# Patient Record
Sex: Female | Born: 1990 | Race: Black or African American | Hispanic: Yes | Smoking: Former smoker
Health system: Southern US, Community
[De-identification: ages and names within clinical notes are randomized; demographics above are authoritative.]

## PROBLEM LIST (undated history)

## (undated) DIAGNOSIS — R519 Headache, unspecified: Secondary | ICD-10-CM

## (undated) HISTORY — PX: NO PAST SURGERIES: SHX2092

---
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
## 2009-04-22 ENCOUNTER — Emergency Department (HOSPITAL_COMMUNITY): Admission: EM | Admit: 2009-04-22 | Discharge: 2009-04-22 | Payer: Self-pay | Admitting: Emergency Medicine

## 2009-06-06 ENCOUNTER — Emergency Department (HOSPITAL_COMMUNITY): Admission: EM | Admit: 2009-06-06 | Discharge: 2009-06-06 | Payer: Self-pay | Admitting: Family Medicine

## 2009-12-13 ENCOUNTER — Emergency Department (HOSPITAL_COMMUNITY): Admission: EM | Admit: 2009-12-13 | Discharge: 2009-12-13 | Payer: Self-pay | Admitting: Family Medicine

## 2012-01-05 ENCOUNTER — Emergency Department (HOSPITAL_COMMUNITY)
Admission: EM | Admit: 2012-01-05 | Discharge: 2012-01-05 | Disposition: A | Discharge status: Home / Self Care | Payer: Self-pay | Attending: Emergency Medicine | Admitting: Emergency Medicine

## 2012-01-05 ENCOUNTER — Encounter (HOSPITAL_COMMUNITY): Payer: Self-pay

## 2012-01-05 DIAGNOSIS — B86 Scabies: Secondary | ICD-10-CM | POA: Insufficient documentation

## 2012-01-05 DIAGNOSIS — F17200 Nicotine dependence, unspecified, uncomplicated: Secondary | ICD-10-CM | POA: Insufficient documentation

## 2012-01-05 MED ORDER — PERMETHRIN 5 % EX CREA: TOPICAL_CREAM | CUTANEOUS | Status: DC

## 2012-01-05 NOTE — ED Notes (Signed)
Generalized rash x 1 month

## 2012-01-05 NOTE — ED Provider Notes (Signed)
Medical screening examination/treatment/procedure(s) were performed by non-physician practitioner and as supervising physician I was immediately available for consultation/collaboration.  Tyianna Menefee, MD 01/05/12 1632 

## 2012-01-05 NOTE — ED Provider Notes (Signed)
History     CSN: [id number]  Arrival date & time 01/05/12  1323   First MD Initiated Contact with Patient 01/05/12 1326      Chief Complaint  Patient presents with  . Rash    (Consider location/radiation/quality/duration/timing/severity/associated sxs/prior treatment) HPI Comments: Patient came to ED with significant other who was seen by me and diagnosed with scabies.  States that she also has pruritic bumps on her bilateral hands and wrists, as well as ankles.  States her thighs itch but she hasn't seen any lesions.  Denies any fevers, discharge from the rash.  Pt denies any known exposures to insects, denies change in personal care products, exposure to any environmental exposures.  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    Patient is a 21 y.o. female presenting with rash. The history is provided by the patient and a significant other.  Rash     History reviewed. No pertinent past medical history.  History reviewed. No pertinent past surgical history.  No family history on file.  History  Substance Use Topics  . Smoking status: Current Every Day Smoker  . Smokeless tobacco: Not on file  . Alcohol Use: Yes    OB History    Grav Para Term Preterm Abortions TAB SAB Ect Mult Living                  Review of Systems  Constitutional: Negative for fever and chills.  HENT: Negative for sore throat and trouble swallowing.   Respiratory: Negative for shortness of breath.   Skin: Positive for rash.    Allergies  Review of patient's allergies indicates no known allergies.  Home Medications   Current Outpatient Rx  Name Route Sig Dispense Refill  . PERMETHRIN 5 % EX CREA  Apply to affected area once.  Leave on for 8-12 hours then wash off.  Repeat in 7 days if needed. 60 g 1    BP 118/75  Pulse 61  Temp 98.5 F (36.9 C) (Oral)  Resp 18  Ht 5\' 7"  (1.702 m)  Wt 220 lb (99.791 kg)  BMI 34.46 kg/m2  SpO2  100%  LMP 12/06/2011  Physical Exam  Nursing note and vitals reviewed. Constitutional: She appears well-developed and well-nourished. No distress.  HENT:  Head: Normocephalic and atraumatic.  Neck: Neck supple.  Pulmonary/Chest: Effort normal.  Neurological: She is alert.  Skin: She is not diaphoretic.       Pt with few papules over both hands and onto ventral wrist.  Few papules over medial ankles as well.  No erythema, edema, warmth, discharge.      ED Course  Procedures (including critical care time)  Labs Reviewed - No data to display No results found.   1. Scabies     MDM  Pt came in to ED with significant other who was diagnosed with scabies.  Pt also with rash consistent with scabies, though her rash is less extensive.  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  No e/o superinfection.  Discussed diagnosis and treatment plan with patient.  Pt given return precautions.  Pt verbalizes understanding and agrees with plan.           [place], [place] 01/05/12 [phone]

## 2013-01-17 ENCOUNTER — Emergency Department (HOSPITAL_COMMUNITY): Payer: 59

## 2013-01-17 ENCOUNTER — Encounter (HOSPITAL_COMMUNITY): Payer: Self-pay | Admitting: Emergency Medicine

## 2013-01-17 ENCOUNTER — Emergency Department (HOSPITAL_COMMUNITY)
Admission: EM | Admit: 2013-01-17 | Discharge: 2013-01-17 | Disposition: A | Discharge status: Home / Self Care | Payer: 59 | Attending: Emergency Medicine | Admitting: Emergency Medicine

## 2013-01-17 DIAGNOSIS — F17200 Nicotine dependence, unspecified, uncomplicated: Secondary | ICD-10-CM | POA: Insufficient documentation

## 2013-01-17 DIAGNOSIS — S4980XA Other specified injuries of shoulder and upper arm, unspecified arm, initial encounter: Secondary | ICD-10-CM | POA: Insufficient documentation

## 2013-01-17 DIAGNOSIS — S46909A Unspecified injury of unspecified muscle, fascia and tendon at shoulder and upper arm level, unspecified arm, initial encounter: Secondary | ICD-10-CM | POA: Insufficient documentation

## 2013-01-17 DIAGNOSIS — S4992XA Unspecified injury of left shoulder and upper arm, initial encounter: Secondary | ICD-10-CM

## 2013-01-17 MED ORDER — HYDROCODONE-ACETAMINOPHEN 5-325 MG PO TABS: 1.0000 | ORAL_TABLET | ORAL | Status: DC | PRN
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
## 2013-01-17 NOTE — ED Notes (Signed)
Pt states she has had vaginal bleeding/spotting for over a month.  Does not want evaluated for that at this time.

## 2013-01-24 NOTE — ED Provider Notes (Signed)
Medical screening examination/treatment/procedure(s) were performed by non-physician practitioner and as supervising physician I was immediately available for consultation/collaboration.  Carlton Sweaney, MD 01/24/13 1439 

## 2014-08-19 IMAGING — CR DG SHOULDER 2+V*L*
3 series · 3 of 3 positions shown · non-contrast
Comparison: None.

CLINICAL DATA: Left shoulder pain. No injury.

EXAM:
LEFT SHOULDER - 2+ VIEW

[w shoulder internal left]
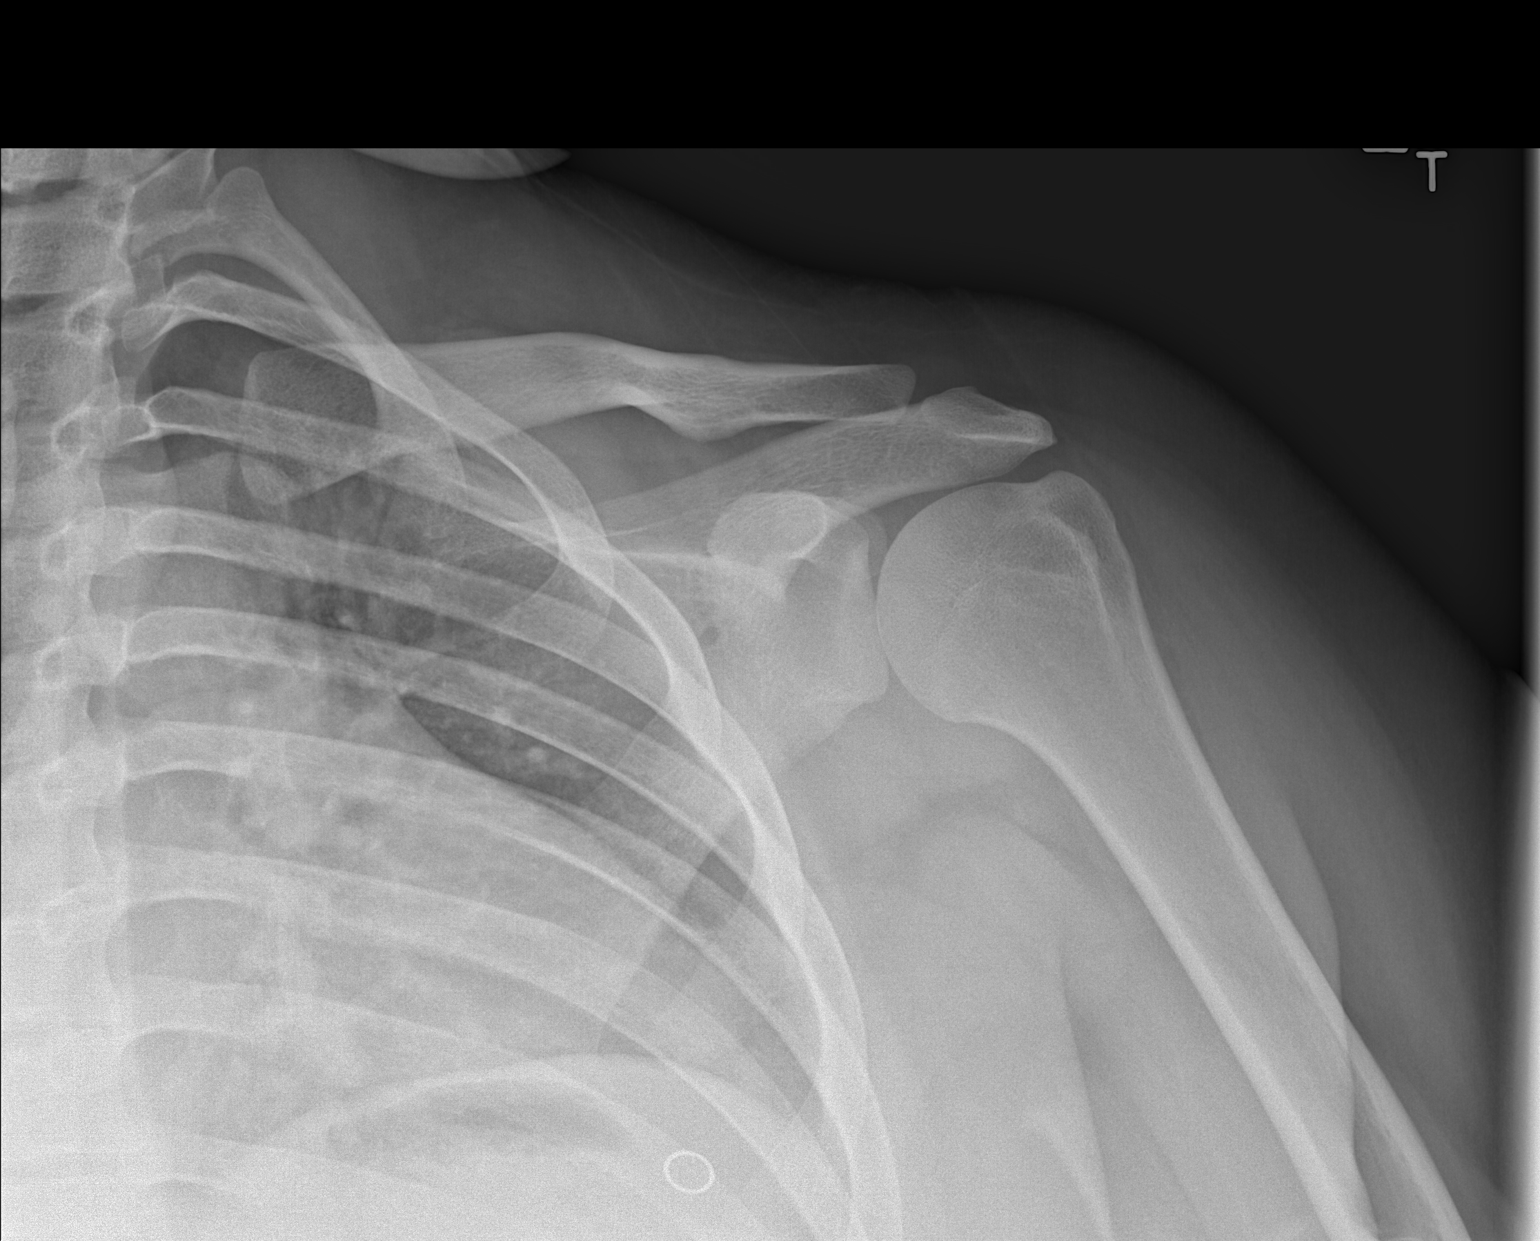

[w shoulder y-view left]
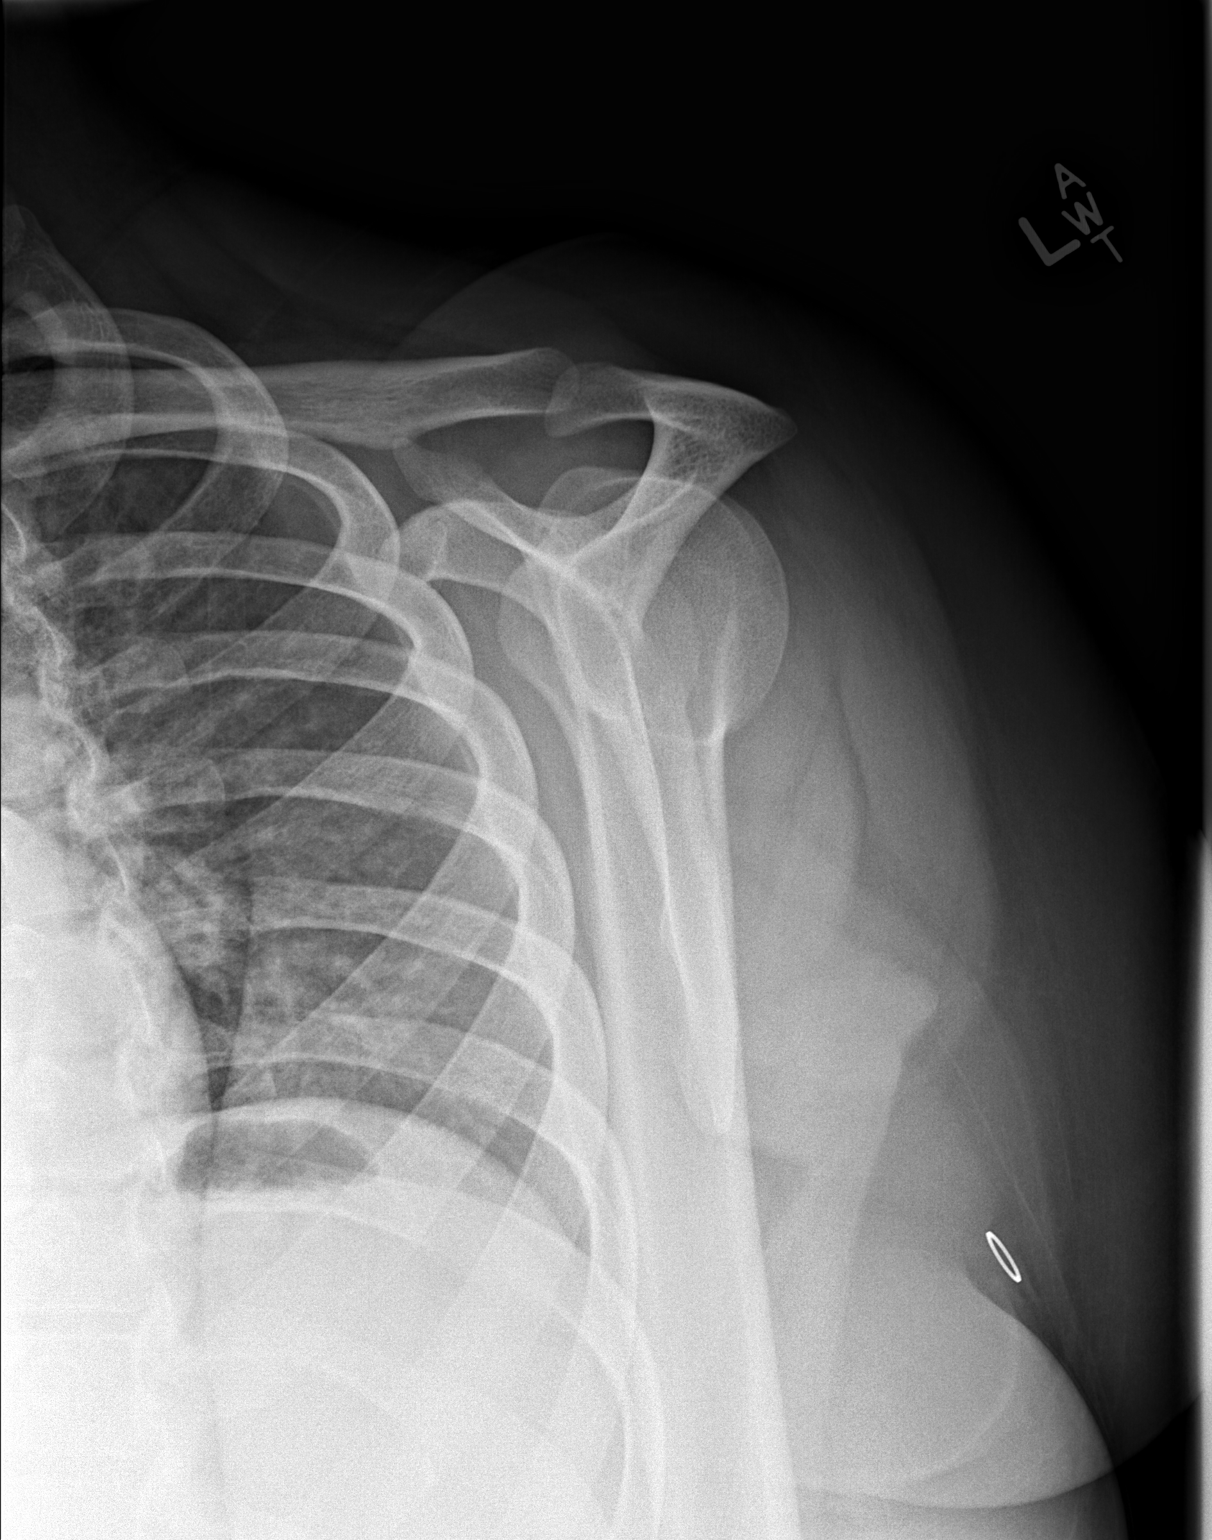

[x shoulder axillary left]
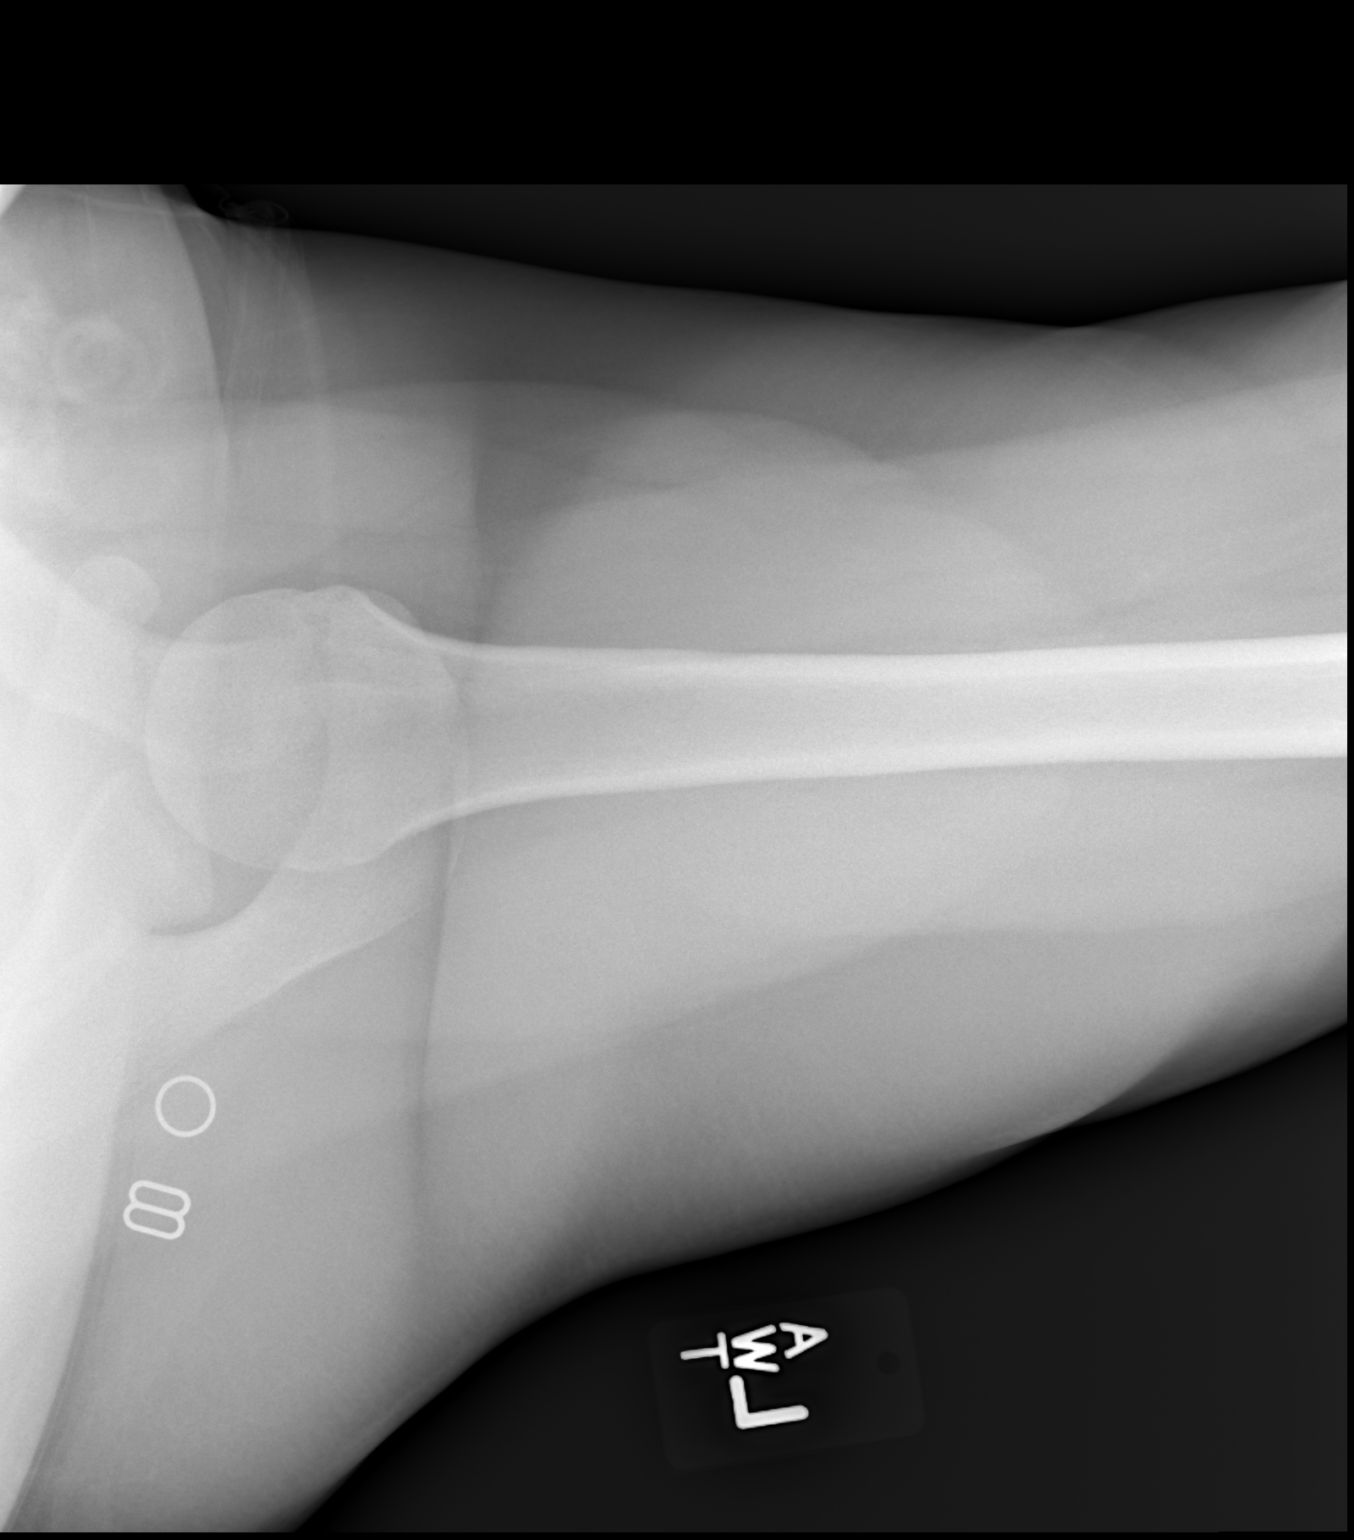

[3 of 3 positions shown; findings below may reference images not displayed]

FINDINGS: There is no evidence of fracture or dislocation. There is no
evidence of arthropathy or other focal bone abnormality. Soft
tissues are unremarkable.
IMPRESSION: Negative.

## 2020-06-13 ENCOUNTER — Other Ambulatory Visit: Payer: Self-pay

## 2020-06-13 ENCOUNTER — Encounter (HOSPITAL_COMMUNITY): Payer: Self-pay

## 2020-06-13 ENCOUNTER — Ambulatory Visit (HOSPITAL_COMMUNITY)
Admission: EM | Admit: 2020-06-13 | Discharge: 2020-06-13 | Disposition: A | Discharge status: Home / Self Care | Payer: Self-pay | Attending: Emergency Medicine | Admitting: Emergency Medicine

## 2020-06-13 DIAGNOSIS — Z202 Contact with and (suspected) exposure to infections with a predominantly sexual mode of transmission: Secondary | ICD-10-CM | POA: Insufficient documentation

## 2020-06-13 LAB — POCT URINALYSIS DIPSTICK, ED / UC
Bilirubin Urine: NEGATIVE
Glucose, UA: NEGATIVE mg/dL
Ketones, ur: NEGATIVE mg/dL
Leukocytes,Ua: NEGATIVE
Nitrite: NEGATIVE
Protein, ur: NEGATIVE mg/dL
Specific Gravity, Urine: 1.01 (ref 1.005–1.030)
Urobilinogen, UA: 0.2 mg/dL (ref 0.0–1.0)
pH: 5.5 (ref 5.0–8.0)

## 2020-06-13 LAB — RAPID HIV SCREEN (HIV 1/2 AB+AG)
HIV 1/2 Antibodies: NONREACTIVE
HIV-1 P24 Antigen - HIV24: NONREACTIVE

## 2020-06-13 LAB — POC URINE PREG, ED: Preg Test, Ur: NEGATIVE

## 2020-06-13 NOTE — ED Triage Notes (Signed)
Pt is here to be tested for STD. Pt denies exposure and sxs.
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
## 2020-06-14 ENCOUNTER — Telehealth (HOSPITAL_COMMUNITY): Payer: Self-pay | Admitting: Emergency Medicine

## 2020-06-14 LAB — CERVICOVAGINAL ANCILLARY ONLY
Bacterial Vaginitis (gardnerella): POSITIVE — AB
Candida Glabrata: NEGATIVE
Candida Vaginitis: NEGATIVE
Chlamydia: NEGATIVE
Comment: NEGATIVE
Comment: NEGATIVE
Comment: NEGATIVE
Comment: NEGATIVE
Comment: NEGATIVE
Comment: NORMAL
Neisseria Gonorrhea: NEGATIVE
Trichomonas: NEGATIVE

## 2020-06-14 LAB — RPR: RPR Ser Ql: NONREACTIVE

## 2020-06-14 MED ORDER — METRONIDAZOLE 500 MG PO TABS: 500.0000 mg | ORAL_TABLET | Freq: Two times a day (BID) | ORAL | 0 refills | Status: DC

## 2020-06-14 NOTE — Telephone Encounter (Signed)
Patient was called and identified. Patient was notified on test results and appropriate treatment guideline. Prescription was sent to pharmacy per treatment guidelines.

## 2021-12-11 ENCOUNTER — Other Ambulatory Visit: Payer: Self-pay

## 2021-12-11 ENCOUNTER — Encounter (HOSPITAL_COMMUNITY): Payer: Self-pay | Admitting: Obstetrics and Gynecology

## 2021-12-11 ENCOUNTER — Inpatient Hospital Stay (HOSPITAL_COMMUNITY): Payer: Self-pay

## 2021-12-11 ENCOUNTER — Inpatient Hospital Stay (HOSPITAL_COMMUNITY)
Admission: AD | Admit: 2021-12-11 | Discharge: 2021-12-12 | Disposition: A | Discharge status: Home / Self Care | Payer: Self-pay | Attending: Obstetrics & Gynecology | Admitting: Obstetrics & Gynecology

## 2021-12-11 ENCOUNTER — Inpatient Hospital Stay (HOSPITAL_COMMUNITY)
Admission: AD | Admit: 2021-12-11 | Discharge: 2021-12-11 | Disposition: A | Discharge status: Home / Self Care | Payer: Self-pay | Attending: Obstetrics and Gynecology | Admitting: Obstetrics and Gynecology

## 2021-12-11 ENCOUNTER — Ambulatory Visit (INDEPENDENT_AMBULATORY_CARE_PROVIDER_SITE_OTHER): Payer: Self-pay

## 2021-12-11 VITALS — BP 129/78 | HR 78 | Wt 248.5 lb

## 2021-12-11 DIAGNOSIS — Z3A01 Less than 8 weeks gestation of pregnancy: Secondary | ICD-10-CM | POA: Insufficient documentation

## 2021-12-11 DIAGNOSIS — Z3201 Encounter for pregnancy test, result positive: Secondary | ICD-10-CM

## 2021-12-11 DIAGNOSIS — R109 Unspecified abdominal pain: Secondary | ICD-10-CM | POA: Insufficient documentation

## 2021-12-11 DIAGNOSIS — O3680X0 Pregnancy with inconclusive fetal viability, not applicable or unspecified: Secondary | ICD-10-CM | POA: Insufficient documentation

## 2021-12-11 DIAGNOSIS — Z87891 Personal history of nicotine dependence: Secondary | ICD-10-CM | POA: Insufficient documentation

## 2021-12-11 DIAGNOSIS — O209 Hemorrhage in early pregnancy, unspecified: Secondary | ICD-10-CM | POA: Insufficient documentation

## 2021-12-11 DIAGNOSIS — Z6790 Unspecified blood type, Rh positive: Secondary | ICD-10-CM

## 2021-12-11 DIAGNOSIS — O26891 Other specified pregnancy related conditions, first trimester: Secondary | ICD-10-CM | POA: Insufficient documentation

## 2021-12-11 DIAGNOSIS — O3680X1 Pregnancy with inconclusive fetal viability, fetus 1: Secondary | ICD-10-CM

## 2021-12-11 DIAGNOSIS — O039 Complete or unspecified spontaneous abortion without complication: Secondary | ICD-10-CM | POA: Insufficient documentation

## 2021-12-11 HISTORY — DX: Headache, unspecified: R51.9

## 2021-12-11 LAB — WET PREP, GENITAL
Clue Cells Wet Prep HPF POC: NONE SEEN
Sperm: NONE SEEN
Trich, Wet Prep: NONE SEEN
WBC, Wet Prep HPF POC: <10 (ref <10)
Yeast Wet Prep HPF POC: NONE SEEN

## 2021-12-11 LAB — CBC
HCT: 38.2 % (ref 36.0–46.0)
Hemoglobin: 12.7 g/dL (ref 12.0–15.0)
MCH: 27.1 pg (ref 26.0–34.0)
MCHC: 33.2 g/dL (ref 30.0–36.0)
MCV: 81.4 fL (ref 80.0–100.0)
Platelets: 340 10*3/uL (ref 150–400)
RBC: 4.69 MIL/uL (ref 3.87–5.11)
RDW: 15.9 % — ABNORMAL HIGH (ref 11.5–15.5)
WBC: 4.4 10*3/uL (ref 4.0–10.5)
nRBC: 0 % (ref 0.0–0.2)

## 2021-12-11 LAB — ABO/RH: ABO/RH(D): A POS

## 2021-12-11 LAB — HCG, QUANTITATIVE, PREGNANCY: hCG, Beta Chain, Quant, S: 12069 m[IU]/mL — ABNORMAL HIGH (ref <5)

## 2021-12-11 LAB — POCT PREGNANCY, URINE: Preg Test, Ur: POSITIVE — AB

## 2021-12-11 NOTE — Progress Notes (Signed)
Pt here today for UPT. Pt states LMP was 10/02/2021. Started spotting this Tuesday and then Wednesday had bleeding like a period that has continued today.  Pt states having cramps and clots. Urine given today for UPT was bloody in color. Pt had positive UPT today in office.  G1P0.   Reviewed with Dr Shawnie Pons. Advised pt to have STAT US for pregnancy of unknown location. Called MFM, no spots available. Will send pt to MAU for evaluation. Dr Crissie Reese made aware at MAU. Pt given results and recommendations. Pt sent to MAU from office. Pt agreeable to plan of care and verbalized understanding.   Resean Brander,RNC

## 2021-12-11 NOTE — MAU Note (Signed)
Andrea Potter is a 31 y.o. at [redacted]w[redacted]d here in MAU reporting: +HPT 8/25. Confirmed at clinic today. spotting started on Tues, now full on bleeding.  Last night started having bad cramps.  Feeling like was going to start cycle. LMP: 7/29 Onset of complaint: Tues Pain score: moderate Vitals:   12/11/21 1007  BP: 127/78  Pulse: 64  Resp: 18  Temp: 98.4 F (36.9 C)  SpO2: 99%      Lab orders placed from triage:

## 2021-12-11 NOTE — MAU Provider Note (Signed)
History     CSN: [id number]  Arrival date and time: 12/11/21 6333   Event Date/Time   First Provider Initiated Contact with Patient 12/11/21 1029      Chief Complaint  Patient presents with   Abdominal Pain   Vaginal Bleeding   31 y.o. G1 @[redacted]w[redacted]d  by sure LMP presenting with cramping and VB. Reports onset of lower abdominal cramping a few days ago then started bleeding yesterday. She soaked through 2 pads yesterday and passed nickel and quarter sized clots. Rates cramping pain 5/10. Has not taken anything for it.    OB History     Gravida  1   Para      Term      Preterm      AB      Living         SAB      IAB      Ectopic      Multiple      Live Births              Past Medical History:  Diagnosis Date   Headache     Past Surgical History:  Procedure Laterality Date   NO PAST SURGERIES      Family History  Problem Relation Age of Onset   Healthy Mother    Diabetes Father     Social History   Tobacco Use   Smoking status: Former    Types: Cigarettes   Smokeless tobacco: Never   Tobacco comments:    Quit 2018  Vaping Use   Vaping Use: Never used  Substance Use Topics   Alcohol use: Not Currently    Comment: social   Drug use: No    Allergies: No Known Allergies  No medications prior to admission.    Review of Systems  Constitutional:  Negative for fatigue.  Gastrointestinal:  Positive for abdominal pain.  Genitourinary:  Positive for vaginal bleeding.   Physical Exam   Blood pressure 119/67, pulse 64, temperature 98.4 F (36.9 C), temperature source Oral, resp. rate 18, height 5' 7.5" (1.715 m), weight 113.4 kg, last menstrual period 11/01/2021, SpO2 99 %.  Physical Exam Vitals and nursing note reviewed. Exam conducted with a chaperone present.  Constitutional:      General: She is not in acute distress.    Appearance: Normal appearance.  HENT:     Head: Normocephalic and atraumatic.  Cardiovascular:     Rate and  Rhythm: Normal rate.  Pulmonary:     Effort: Pulmonary effort is normal. No respiratory distress.  Abdominal:     General: There is no distension.     Palpations: Abdomen is soft. There is no mass.     Tenderness: There is no abdominal tenderness. There is no guarding or rebound.     Hernia: No hernia is present.  Genitourinary:    Comments: External: no lesions or erythema Vagina: rugated, pink, moist, small amt bloody discharge, cleared with 1 fox swab Uterus/Adnexae: indeterminate d/t habitus, no CMT Cervix FT   Musculoskeletal:        General: Normal range of motion.     Cervical back: Normal range of motion.  Skin:    General: Skin is warm and dry.  Neurological:     General: No focal deficit present.     Mental Status: She is alert and oriented to person, place, and time.  Psychiatric:        Mood and Affect: Mood normal.  Behavior: Behavior normal.    Results for orders placed or performed during the hospital encounter of 12/11/21 (from the past 24 hour(s))  ABO/Rh     Status: None   Collection Time: 12/11/21 10:11 AM  Result Value Ref Range   ABO/RH(D) A POS    No rh immune globuloin      NOT A RH IMMUNE GLOBULIN CANDIDATE, PT RH POSITIVE Performed at [facility], [address] [place], [place] [address]   CBC     Status: Abnormal   Collection Time: 12/11/21 10:11 AM  Result Value Ref Range   WBC 4.4 4.0 - 10.5 K/uL   RBC 4.69 3.87 - 5.11 MIL/uL   Hemoglobin 12.7 12.0 - 15.0 g/dL   HCT 88.4 16.6 - 06.3 %   MCV 81.4 80.0 - 100.0 fL   MCH 27.1 26.0 - 34.0 pg   MCHC 33.2 30.0 - 36.0 g/dL   RDW 01.6 (H) 01.0 - 93.2 %   Platelets 340 150 - 400 K/uL   nRBC 0.0 0.0 - 0.2 %  hCG, quantitative, pregnancy     Status: Abnormal   Collection Time: 12/11/21 10:11 AM  Result Value Ref Range   hCG, Beta Chain, Quant, S 12,069 (H) <5 mIU/mL  Wet prep, genital     Status: None   Collection Time: 12/11/21 10:40 AM   Specimen: Vaginal  Result Value Ref  Range   Yeast Wet Prep HPF POC NONE SEEN NONE SEEN   Trich, Wet Prep NONE SEEN NONE SEEN   Clue Cells Wet Prep HPF POC NONE SEEN NONE SEEN   WBC, Wet Prep HPF POC <10 <10   Sperm NONE SEEN    US OB LESS THAN 14 WEEKS WITH OB TRANSVAGINAL  Result Date: 12/11/2021 CLINICAL DATA:  Vaginal bleeding, first trimester pregnancy EXAM: OBSTETRIC <14 WK [place] AND TRANSVAGINAL OB US TECHNIQUE: Both transabdominal and transvaginal ultrasound examinations were performed for complete evaluation of the gestation as well as the maternal uterus, adnexal regions, and pelvic cul-de-sac. Transvaginal technique was performed to assess early pregnancy. COMPARISON:  None Available. FINDINGS: Intrauterine gestational sac: Single Yolk sac:  Not seen Embryo:  Seen Cardiac Activity: Not seen Heart Rate:   bpm CRL:  4.45 mm   6 w   1 d                  [place] EDC: 08/02/2022 Subchorionic hemorrhage:  None visualized. Maternal uterus/adnexae: Unremarkable. IMPRESSION: There is a gestational sac containing fetal pole within the fundus of the uterus. There is no demonstrable fetal cardiac activity. Findings suggest possible failed gestation with incomplete abortion. Another diagnostic possibility would be very early IUP. Please correlate with serial hCG estimations and consider short-term follow-up sonogram in 1-2 weeks. There are no adnexal masses.  There is no free fluid in pelvis. Electronically Signed   By: Ernie Avena M.D.   On: 12/11/2021 11:44    MAU Course  Procedures  MDM Declines analgesic. Labs and [place] ordered and reviewed. IUP seen on [place] although no cardiac activity, doesn't meet criteria but suspicious for nonviable pregnancy. Discussed findings with pt and partner. Plan for f/u US in 1 week for viability. Stable for discharge home.  Assessment and Plan   1. [redacted] weeks gestation of pregnancy   2. Pregnancy with inconclusive fetal viability, single or unspecified fetus    Discharge home Follow up at [facility] in 1 week  for US-ordered SAB/bleeding precautions Pelvic rest   Allergies  as of 12/11/2021   No Known Allergies      Medication List    You have not been prescribed any medications.     Donette Larry, CNM 12/11/2021, 12:58 PM

## 2021-12-12 ENCOUNTER — Encounter (HOSPITAL_COMMUNITY): Payer: Self-pay | Admitting: Obstetrics & Gynecology

## 2021-12-12 DIAGNOSIS — O039 Complete or unspecified spontaneous abortion without complication: Secondary | ICD-10-CM

## 2021-12-12 MED ORDER — ONDANSETRON 4 MG PO TBDP
8.0000 mg | ORAL_TABLET | Freq: Once | ORAL | Status: AC
  Administered 2021-12-12: 8 mg via ORAL
  Filled 2021-12-12 (×2): qty 2

## 2021-12-12 NOTE — MAU Note (Signed)
.  Andrea Potter is a 31 y.o. at [redacted]w[redacted]d here in MAU reporting increased vag bleeding and abdominal cramping. Was seen in MAU earlier today for the same. States went home and ate and took a nap. Awoke at 1900 with bleeding and cramping. Had BM x 2 and with second BM passed a clot which she took a picture of it but did not save it. Came in by EMS and registered in lobby  Onset of complaint: 1900 Pain score: 6 Vitals:   12/12/21 0033  BP: 139/66  Pulse: (!) 56  Resp: 17  Temp: 97.8 F (36.6 C)  SpO2: 97%     FHT:n/a Lab orders placed from triage:  none

## 2021-12-12 NOTE — MAU Provider Note (Signed)
History     CSN: [id number]  Arrival date and time: 12/11/21 2303   Event Date/Time   First Provider Initiated Contact with Patient 12/12/21 0045      Chief Complaint  Patient presents with   Abdominal Pain   Vaginal Bleeding   Andrea Potter is a 31 y.o. G1P0 at 5.6 weeks by LMP.  She presents today for Abdominal Pain and Vaginal Bleeding.  She states she had passed a clot about the size of a half dollar when using the restroom.  She reports shortly after she started to experience pain in her lower abdomen that she describes as sharp, but intermittent.  She states the pain was a 8/10 at that time.  She reports the pain worsened and she took some Aleve and called EMS.  She states upon arrival her pain was the same and occurring every 3-5 minutes. She states she has not passed any other clots and her bleeding has "slowed down."      OB History     Gravida  1   Para      Term      Preterm      AB      Living         SAB      IAB      Ectopic      Multiple      Live Births              Past Medical History:  Diagnosis Date   Headache     Past Surgical History:  Procedure Laterality Date   NO PAST SURGERIES      Family History  Problem Relation Age of Onset   Healthy Mother    Diabetes Father     Social History   Tobacco Use   Smoking status: Former    Types: Cigarettes   Smokeless tobacco: Never   Tobacco comments:    Quit 2018  Vaping Use   Vaping Use: Never used  Substance Use Topics   Alcohol use: Not Currently    Comment: social   Drug use: Yes    Comment: regular use    Allergies: No Known Allergies  Medications Prior to Admission  Medication Sig Dispense Refill Last Dose   naproxen sodium (ALEVE) 220 MG tablet Take 220 mg by mouth.   12/11/2021    Review of Systems  Gastrointestinal:  Positive for abdominal pain.  Genitourinary:  Positive for vaginal bleeding. Negative for difficulty urinating and dysuria.   Physical Exam    Blood pressure 139/66, pulse (!) 56, temperature 97.8 F (36.6 C), resp. rate 17, height 5' 7.5" (1.715 m), weight 113.3 kg, last menstrual period 11/01/2021, SpO2 97 %.  Physical Exam Vitals reviewed.  Constitutional:      Appearance: Normal appearance. She is well-developed.  HENT:     Head: Normocephalic and atraumatic.  Eyes:     Conjunctiva/sclera: Conjunctivae normal.  Cardiovascular:     Rate and Rhythm: Normal rate.  Pulmonary:     Effort: Pulmonary effort is normal. No respiratory distress.  Musculoskeletal:        General: Normal range of motion.     Cervical back: Normal range of motion.  Skin:    General: Skin is warm.  Neurological:     Mental Status: She is alert and oriented to person, place, and time.  Psychiatric:        Mood and Affect: Mood normal.  Behavior: Behavior normal.     MAU Course  Procedures Results for orders placed or performed during the hospital encounter of 12/11/21 (from the past 24 hour(s))  ABO/Rh     Status: None   Collection Time: 12/11/21 10:11 AM  Result Value Ref Range   ABO/RH(D) A POS    No rh immune globuloin      NOT A RH IMMUNE GLOBULIN CANDIDATE, PT RH POSITIVE Performed at [facility], [address] [place], [place] [address]   CBC     Status: Abnormal   Collection Time: 12/11/21 10:11 AM  Result Value Ref Range   WBC 4.4 4.0 - 10.5 K/uL   RBC 4.69 3.87 - 5.11 MIL/uL   Hemoglobin 12.7 12.0 - 15.0 g/dL   HCT 83.3 82.5 - 05.3 %   MCV 81.4 80.0 - 100.0 fL   MCH 27.1 26.0 - 34.0 pg   MCHC 33.2 30.0 - 36.0 g/dL   RDW 97.6 (H) 73.4 - 19.3 %   Platelets 340 150 - 400 K/uL   nRBC 0.0 0.0 - 0.2 %  hCG, quantitative, pregnancy     Status: Abnormal   Collection Time: 12/11/21 10:11 AM  Result Value Ref Range   hCG, Beta Chain, Quant, S 12,069 (H) <5 mIU/mL  Wet prep, genital     Status: None   Collection Time: 12/11/21 10:40 AM   Specimen: Vaginal  Result Value Ref Range   Yeast Wet Prep HPF POC NONE  SEEN NONE SEEN   Trich, Wet Prep NONE SEEN NONE SEEN   Clue Cells Wet Prep HPF POC NONE SEEN NONE SEEN   WBC, Wet Prep HPF POC <10 <10   Sperm NONE SEEN    US OB Transvaginal  Result Date: 12/12/2021 CLINICAL DATA:  Vaginal bleeding and passing clots in the first trimester. EXAM: TRANSVAGINAL OB ULTRASOUND TECHNIQUE: Transvaginal ultrasound was performed for complete evaluation of the gestation as well as the maternal uterus, adnexal regions, and pelvic cul-de-sac. COMPARISON:  Ob ultrasound yesterday. FINDINGS: Intrauterine gestational sac: No longer visible. Yolk sac:  Not Visualized. Embryo:  No longer visible. Cardiac Activity: N/a Subchorionic hemorrhage:  None visualized. Maternal uterus/adnexae: The uterus not optimally visualized due to its position. There is heterogeneity and thickening of the endometrial echo complex without significant vascularity. There is extension of hypoechoic avascular material into the partially open cervix with the cervical channel measuring up to 7.4 mm in diameter. No uterine wall mass is seen. No free fluid is evident.  Both ovaries are obscured by bowel gas. IMPRESSION: 1. Findings likely indicate an abortion in progress or missed abortion. The uterine contents are not optimally visualized but no vascular flow is convincingly seen within the uterus and there is no longer visualization of a fetal pole and yolk sac. There is heterogeneous thickening of the endometrial echo complex most likely due to blood clots. 2. Extension of material into the partially open cervix. 3. Nonvisualization of both ovaries. 4. If there are continued symptoms, short interval follow-up study would be helpful to exclude retained products of conception. Electronically Signed   By: Almira Bar M.D.   On: 12/12/2021 00:43   US OB LESS THAN 14 WEEKS WITH OB TRANSVAGINAL  Result Date: 12/11/2021 CLINICAL DATA:  Vaginal bleeding, first trimester pregnancy EXAM: OBSTETRIC <14 WK [place] AND  TRANSVAGINAL OB US TECHNIQUE: Both transabdominal and transvaginal ultrasound examinations were performed for complete evaluation of the gestation as well as the maternal uterus, adnexal regions, and  pelvic cul-de-sac. Transvaginal technique was performed to assess early pregnancy. COMPARISON:  None Available. FINDINGS: Intrauterine gestational sac: Single Yolk sac:  Not seen Embryo:  Seen Cardiac Activity: Not seen Heart Rate:   bpm CRL:  4.45 mm   6 w   1 d                  [place] EDC: 08/02/2022 Subchorionic hemorrhage:  None visualized. Maternal uterus/adnexae: Unremarkable. IMPRESSION: There is a gestational sac containing fetal pole within the fundus of the uterus. There is no demonstrable fetal cardiac activity. Findings suggest possible failed gestation with incomplete abortion. Another diagnostic possibility would be very early IUP. Please correlate with serial hCG estimations and consider short-term follow-up sonogram in 1-2 weeks. There are no adnexal masses.  There is no free fluid in pelvis. Electronically Signed   By: Ernie Avena M.D.   On: 12/11/2021 11:44    MDM Ultrasound Antiemetic Assessment and Plan  31 year old Spontaneous Abortion  -Patient sent to [place] upon arrival. -Results as above. -Provider to bedside to discuss. -Condolences given.  -Informed of follow up in 2 weeks with repeat labs. -Precautions reviewed. -Patient requests medication for nausea. Zofran ordered. -Informed okay to take ibuprofen at home for pain. -Message sent to [facility] for Follow Up appt. -Discharged to home in stable condition.  Cherre Robins 12/12/2021, 12:45 AM

## 2021-12-12 NOTE — MAU Note (Signed)
Pt was having to wait in lobby for Triage due to MAU being busy. Pt taken for u/s by w/c. Pt evaluated earlier today in MAU and returned by EMS due to increased abdominal pain and bleeding

## 2021-12-12 NOTE — Progress Notes (Signed)
Gerrit Heck CNM in earlier to discuss u/s report and d/c plan. Written and verbal d/c instructions given and understanding voiced.

## 2021-12-15 LAB — GC/CHLAMYDIA PROBE AMP (~~LOC~~) NOT AT ARMC
Chlamydia: NEGATIVE
Comment: NEGATIVE
Comment: NORMAL
Neisseria Gonorrhea: NEGATIVE

## 2021-12-16 ENCOUNTER — Inpatient Hospital Stay (HOSPITAL_COMMUNITY)
Admission: AD | Admit: 2021-12-16 | Discharge: 2021-12-16 | Disposition: A | Discharge status: Home / Self Care | Payer: Self-pay | Attending: Obstetrics & Gynecology | Admitting: Obstetrics & Gynecology

## 2021-12-16 ENCOUNTER — Other Ambulatory Visit: Payer: Self-pay

## 2021-12-16 ENCOUNTER — Inpatient Hospital Stay (HOSPITAL_COMMUNITY): Payer: Self-pay

## 2021-12-16 DIAGNOSIS — O039 Complete or unspecified spontaneous abortion without complication: Secondary | ICD-10-CM

## 2021-12-16 DIAGNOSIS — O034 Incomplete spontaneous abortion without complication: Secondary | ICD-10-CM | POA: Insufficient documentation

## 2021-12-16 DIAGNOSIS — Z3A01 Less than 8 weeks gestation of pregnancy: Secondary | ICD-10-CM | POA: Insufficient documentation

## 2021-12-16 DIAGNOSIS — O26891 Other specified pregnancy related conditions, first trimester: Secondary | ICD-10-CM | POA: Insufficient documentation

## 2021-12-16 LAB — CBC
HCT: 36 % (ref 36.0–46.0)
Hemoglobin: 11.9 g/dL — ABNORMAL LOW (ref 12.0–15.0)
MCH: 27.5 pg (ref 26.0–34.0)
MCHC: 33.1 g/dL (ref 30.0–36.0)
MCV: 83.3 fL (ref 80.0–100.0)
Platelets: 348 10*3/uL (ref 150–400)
RBC: 4.32 MIL/uL (ref 3.87–5.11)
RDW: 15.8 % — ABNORMAL HIGH (ref 11.5–15.5)
WBC: 5 10*3/uL (ref 4.0–10.5)
nRBC: 0 % (ref 0.0–0.2)

## 2021-12-16 LAB — HCG, QUANTITATIVE, PREGNANCY: hCG, Beta Chain, Quant, S: 178 m[IU]/mL — ABNORMAL HIGH (ref <5)

## 2021-12-16 NOTE — MAU Note (Signed)
Andrea Potter is a 31 y.o. at [redacted]w[redacted]d here in MAU reporting: ongoing pain and bleeding since previous MAU visit. States she is still seeing some clots. Changing about 4-5 pads per day but not saturated.   Onset of complaint: ongoing  Pain score: 6/10  Vitals:   12/16/21 1726  BP: 120/77  Pulse: 67  Resp: 16  Temp: 98 F (36.7 C)  SpO2: 100%     Lab orders placed from triage: none

## 2021-12-16 NOTE — MAU Provider Note (Signed)
History     CSN: [id number]  Arrival date and time: 12/16/21 1712   None     Chief Complaint  Patient presents with   Abdominal Pain   Vaginal Bleeding   Andrea Potter, a  31 y.o. G1P0 at [redacted]w[redacted]d presents to MAU with complaints of intermittent abdominal pain and vaginal bleeding. Patient states she was seen in MAU on 12/12/21 for a SAB in process. She states she bled for "2 days afterwards and passed something large." She stopped bleeding and cramping for 1 day then today started intermittently bleeding and cramping again. States changing pad multiple times throughout the day, but not saturating any pads. She describes bleeding and red and brown and has small clots throughout the day. Currently rating pain a 7/10. Denies urinary symptoms.          Abdominal Pain The primary symptoms of the illness include abdominal pain and vaginal bleeding. The primary symptoms of the illness do not include fever, fatigue, shortness of breath, nausea, vomiting, diarrhea, dysuria or vaginal discharge.  Symptoms associated with the illness do not include chills, constipation or back pain.  Vaginal Bleeding Associated symptoms include abdominal pain. Pertinent negatives include no chest pain, chills, fatigue, fever, headaches, nausea, vomiting or weakness.    OB History     Gravida  1   Para      Term      Preterm      AB      Living         SAB      IAB      Ectopic      Multiple      Live Births              Past Medical History:  Diagnosis Date   Headache     Past Surgical History:  Procedure Laterality Date   NO PAST SURGERIES      Family History  Problem Relation Age of Onset   Healthy Mother    Diabetes Father     Social History   Tobacco Use   Smoking status: Former    Types: Cigarettes   Smokeless tobacco: Never   Tobacco comments:    Quit 2018  Vaping Use   Vaping Use: Never used  Substance Use Topics   Alcohol use: Not Currently    Comment:  social   Drug use: Yes    Comment: regular use    Allergies: No Known Allergies  Medications Prior to Admission  Medication Sig Dispense Refill Last Dose   naproxen sodium (ALEVE) 220 MG tablet Take 220 mg by mouth.       Review of Systems  Constitutional:  Negative for chills, fatigue and fever.  Eyes:  Negative for pain and visual disturbance.  Respiratory:  Negative for apnea, shortness of breath and wheezing.   Cardiovascular:  Negative for chest pain and palpitations.  Gastrointestinal:  Positive for abdominal pain. Negative for constipation, diarrhea, nausea and vomiting.  Genitourinary:  Positive for vaginal bleeding. Negative for difficulty urinating, dysuria, pelvic pain, vaginal discharge and vaginal pain.  Musculoskeletal:  Negative for back pain.  Neurological:  Negative for seizures, weakness and headaches.  Psychiatric/Behavioral:  Negative for suicidal ideas.    Physical Exam   Blood pressure 120/77, pulse 67, temperature 98 F (36.7 C), temperature source Oral, resp. rate 16, height 5' 7.5" (1.715 m), weight 114.5 kg, last menstrual period 11/01/2021, SpO2 100 %.  Physical Exam Vitals and nursing note reviewed.  Constitutional:      General: She is not in acute distress.    Appearance: Normal appearance.  HENT:     Head: Normocephalic.  Pulmonary:     Effort: Pulmonary effort is normal.  Musculoskeletal:     Cervical back: Normal range of motion.  Skin:    General: Skin is warm and dry.  Neurological:     Mental Status: She is alert and oriented to person, place, and time.  Psychiatric:        Mood and Affect: Mood normal.     MAU Course  Procedures Lab and ultrasound orders placed from the lobby.   Orders Placed This Encounter  Procedures   US OB Transvaginal   CBC   hCG, quantitative, pregnancy   Discharge patient   Results for orders placed or performed during the hospital encounter of 12/16/21 (from the past 24 hour(s))  CBC     Status:  Abnormal   Collection Time: 12/16/21  6:18 PM  Result Value Ref Range   WBC 5.0 4.0 - 10.5 K/uL   RBC 4.32 3.87 - 5.11 MIL/uL   Hemoglobin 11.9 (L) 12.0 - 15.0 g/dL   HCT 78.2 42.3 - 53.6 %   MCV 83.3 80.0 - 100.0 fL   MCH 27.5 26.0 - 34.0 pg   MCHC 33.1 30.0 - 36.0 g/dL   RDW 14.4 (H) 31.5 - 40.0 %   Platelets 348 150 - 400 K/uL   nRBC 0.0 0.0 - 0.2 %  hCG, quantitative, pregnancy     Status: Abnormal   Collection Time: 12/16/21  6:18 PM  Result Value Ref Range   hCG, Beta Chain, Quant, S 178 (H) <5 mIU/mL  US OB Transvaginal  Result Date: 12/16/2021 CLINICAL DATA:  Pregnant, abdominal pain, vaginal bleeding EXAM: TRANSVAGINAL OB ULTRASOUND TECHNIQUE: Transvaginal ultrasound was performed for complete evaluation of the gestation as well as the maternal uterus, adnexal regions, and pelvic cul-de-sac. COMPARISON:  12/12/2021 FINDINGS: Intrauterine gestational sac: None Maternal uterus/adnexae: Endometrial complex measures 11 mm and is mildly heterogeneous. Mild vascularity. Bilateral ovaries are within normal limits. No free fluid. IMPRESSION: No IUP is visualized, compatible with the patient's known spontaneous abortion. Heterogeneous endometrial complex, measuring 11 mm, with mild vascularity. In the appropriate clinical context, this appearance is suspicious for retained products of conception. Electronically Signed   By: Charline Bills M.D.   On: 12/16/2021 19:05     MDM Lab results reviewed and interpreted by me.   - Hbg 11.9 and platelet count 348. Patient overall hemodynamically stable.  - US revealed No IUP, and 6mm endometrium in the presence of continued vaginal bleeding and abdominal cramping suspicious for retained products.  - CNM to bedside. Results reviewed with patient and patient's mother. Discussed expectant management vs Cytotec and pain management. Risks and benefits of both options discussed and all questions answered at bedside .Patient desires expectant management  at this time. Through shared decision making and patient clinical presentation CNM agrees with plan of care.   - Plan for discharge  - HBG Quant 178, reassuring finding.   Assessment and Plan   1. SAB (spontaneous abortion)   2. [redacted] weeks gestation of pregnancy   3. Retained products of conception after miscarriage    - Discussed that based on today [place], suspicion for retained products of conception, but cannot be definite.  - Cytotec v Expectant management. Patient desires to continue with expectant mgt. - Bleeding precautions and expectations reviewed in depth with Mother of  patient at bedside.  - Discussed Tylenol and Ibuprofen regimen for cramping during this period.  - Recommended that patient follow up within 1 week for quant and assess bleeding. Message sent to [facility] to get patient scheduled.  - Patient discharged home in stable condition and may return to MAU as needed.   Claudette Head, MSN CNM  12/16/2021, 7:56 PM

## 2021-12-18 ENCOUNTER — Other Ambulatory Visit: Payer: Self-pay

## 2021-12-24 ENCOUNTER — Other Ambulatory Visit: Payer: Self-pay

## 2021-12-24 ENCOUNTER — Ambulatory Visit: Payer: Self-pay

## 2021-12-24 DIAGNOSIS — O039 Complete or unspecified spontaneous abortion without complication: Secondary | ICD-10-CM

## 2021-12-25 LAB — BETA HCG QUANT (REF LAB): hCG Quant: 11 m[IU]/mL

## 2022-01-06 ENCOUNTER — Ambulatory Visit (INDEPENDENT_AMBULATORY_CARE_PROVIDER_SITE_OTHER): Payer: Self-pay | Admitting: Family Medicine

## 2022-01-06 ENCOUNTER — Encounter: Payer: Self-pay | Admitting: Family Medicine

## 2022-01-06 ENCOUNTER — Other Ambulatory Visit: Payer: Self-pay

## 2022-01-06 VITALS — BP 139/67 | HR 53 | Ht 67.0 in | Wt 249.0 lb

## 2022-01-06 DIAGNOSIS — O039 Complete or unspecified spontaneous abortion without complication: Secondary | ICD-10-CM | POA: Insufficient documentation

## 2022-01-06 NOTE — Progress Notes (Signed)
    SUBJECTIVE:   CHIEF COMPLAINT / HPI:   SAB f/u  Patient presenting to the clinic today for SAB follow-up.  SAB occurred 9/7.  The conservative management with pain control using ibuprofen.  Did not receive any Cytotec.  She reports that her bleeding is completely stopped.  She has no abdominal pain.  She is feeling well.  She reports that she is interested in getting pregnant in the near future.  Discussed use of prenatal vitamins daily and patient agrees to this.  Patient did have beta-hCG trended and last check was on 9/20 was 11.  We will repeat today.  No further questions or concerns.   OBJECTIVE:   BP 139/67   Pulse (!) 53   Ht 5\' 7"  (1.702 m)   Wt 249 lb (112.9 kg)   LMP 11/01/2021 (Exact Date) Comment: SAB  Breastfeeding Unknown   BMI 39.00 kg/m   General: Pleasant, well-appearing 31 year old female in no acute distress Cardiac: Regular rate Respiratory: Normal work of breathing, speaking in full sentences Abdomen: Soft, nontender, nondistended MSK: No gross abnormalities  ASSESSMENT/PLAN:   SAB (spontaneous abortion) Patient currently doing well.  Repeat beta-hCG collected today.  If remaining elevated will consider pelvic ultrasound.  Recommended patient take daily prenatal vitamins given she is trying to get pregnant again.  Follow-up as needed.     Concepcion Living, MD [place]

## 2022-01-06 NOTE — Assessment & Plan Note (Addendum)
Patient currently doing well.  Repeat beta-hCG collected today.  If remaining elevated will consider pelvic ultrasound.  Recommended patient take daily prenatal vitamins given she is trying to get pregnant again.  Follow-up as needed.

## 2022-01-06 NOTE — Progress Notes (Signed)
Pt reports no pain nor bleeding. 

## 2022-01-07 LAB — BETA HCG QUANT (REF LAB): hCG Quant: 1 m[IU]/mL

## 2022-12-08 ENCOUNTER — Other Ambulatory Visit: Payer: Self-pay | Admitting: Obstetrics and Gynecology

## 2022-12-08 DIAGNOSIS — O3680X0 Pregnancy with inconclusive fetal viability, not applicable or unspecified: Secondary | ICD-10-CM
